# Patient Record
Sex: Female | Born: 1950
Health system: Southern US, Community
[De-identification: ages and names within clinical notes are randomized; demographics above are authoritative.]

## PROBLEM LIST (undated history)

## (undated) DIAGNOSIS — N6320 Unspecified lump in the left breast, unspecified quadrant: Secondary | ICD-10-CM

## (undated) DIAGNOSIS — Z87898 Personal history of other specified conditions: Secondary | ICD-10-CM

## (undated) DIAGNOSIS — E039 Hypothyroidism, unspecified: Secondary | ICD-10-CM

## (undated) HISTORY — PX: THYROIDECTOMY, PARTIAL: SHX18

## (undated) HISTORY — PX: BREAST CYST EXCISION: SHX579

## (undated) HISTORY — PX: SHOULDER ARTHROSCOPY: SHX128

## (undated) HISTORY — PX: EYE SURGERY: SHX253

## (undated) HISTORY — PX: ABDOMINAL HYSTERECTOMY: SHX81

## (undated) HISTORY — PX: APPENDECTOMY: SHX54

---

## 2003-05-20 ENCOUNTER — Ambulatory Visit (HOSPITAL_BASED_OUTPATIENT_CLINIC_OR_DEPARTMENT_OTHER): Admission: RE | Admit: 2003-05-20 | Discharge: 2003-05-20 | Payer: Self-pay | Admitting: Orthopedic Surgery

## 2003-05-20 ENCOUNTER — Ambulatory Visit (HOSPITAL_COMMUNITY): Admission: RE | Admit: 2003-05-20 | Discharge: 2003-05-20 | Payer: Self-pay | Admitting: Orthopedic Surgery

## 2004-08-25 ENCOUNTER — Ambulatory Visit: Payer: Self-pay | Admitting: Cardiology

## 2004-09-13 ENCOUNTER — Ambulatory Visit: Payer: Self-pay

## 2004-09-29 ENCOUNTER — Ambulatory Visit: Payer: Self-pay | Admitting: Cardiology

## 2004-10-26 ENCOUNTER — Ambulatory Visit: Payer: Self-pay | Admitting: Cardiology

## 2005-04-24 ENCOUNTER — Ambulatory Visit: Payer: Self-pay | Admitting: Cardiology

## 2005-05-03 ENCOUNTER — Ambulatory Visit: Payer: Self-pay | Admitting: Cardiology

## 2015-06-08 DIAGNOSIS — H2510 Age-related nuclear cataract, unspecified eye: Secondary | ICD-10-CM | POA: Insufficient documentation

## 2015-06-09 DIAGNOSIS — R1084 Generalized abdominal pain: Secondary | ICD-10-CM | POA: Insufficient documentation

## 2015-06-09 DIAGNOSIS — M75102 Unspecified rotator cuff tear or rupture of left shoulder, not specified as traumatic: Secondary | ICD-10-CM | POA: Insufficient documentation

## 2015-06-09 DIAGNOSIS — M545 Low back pain, unspecified: Secondary | ICD-10-CM | POA: Insufficient documentation

## 2015-06-09 DIAGNOSIS — M19071 Primary osteoarthritis, right ankle and foot: Secondary | ICD-10-CM | POA: Insufficient documentation

## 2015-06-09 DIAGNOSIS — M810 Age-related osteoporosis without current pathological fracture: Secondary | ICD-10-CM | POA: Insufficient documentation

## 2015-06-09 DIAGNOSIS — J342 Deviated nasal septum: Secondary | ICD-10-CM | POA: Insufficient documentation

## 2015-06-09 DIAGNOSIS — E039 Hypothyroidism, unspecified: Secondary | ICD-10-CM | POA: Insufficient documentation

## 2015-06-09 DIAGNOSIS — I872 Venous insufficiency (chronic) (peripheral): Secondary | ICD-10-CM | POA: Insufficient documentation

## 2015-06-09 DIAGNOSIS — H903 Sensorineural hearing loss, bilateral: Secondary | ICD-10-CM | POA: Insufficient documentation

## 2015-06-09 DIAGNOSIS — H524 Presbyopia: Secondary | ICD-10-CM | POA: Insufficient documentation

## 2015-06-09 DIAGNOSIS — H5201 Hypermetropia, right eye: Secondary | ICD-10-CM | POA: Insufficient documentation

## 2015-06-09 DIAGNOSIS — H43813 Vitreous degeneration, bilateral: Secondary | ICD-10-CM | POA: Insufficient documentation

## 2015-06-09 DIAGNOSIS — I10 Essential (primary) hypertension: Secondary | ICD-10-CM | POA: Insufficient documentation

## 2015-11-08 DIAGNOSIS — Z961 Presence of intraocular lens: Secondary | ICD-10-CM | POA: Insufficient documentation

## 2015-11-22 DIAGNOSIS — H401121 Primary open-angle glaucoma, left eye, mild stage: Secondary | ICD-10-CM | POA: Insufficient documentation

## 2015-11-22 DIAGNOSIS — H40021 Open angle with borderline findings, high risk, right eye: Secondary | ICD-10-CM | POA: Insufficient documentation

## 2016-03-28 DIAGNOSIS — R928 Other abnormal and inconclusive findings on diagnostic imaging of breast: Secondary | ICD-10-CM | POA: Insufficient documentation

## 2016-03-30 ENCOUNTER — Other Ambulatory Visit: Payer: Self-pay | Admitting: Family Medicine

## 2016-03-30 DIAGNOSIS — N6489 Other specified disorders of breast: Secondary | ICD-10-CM

## 2016-03-30 DIAGNOSIS — M810 Age-related osteoporosis without current pathological fracture: Secondary | ICD-10-CM

## 2016-03-30 DIAGNOSIS — R928 Other abnormal and inconclusive findings on diagnostic imaging of breast: Secondary | ICD-10-CM

## 2016-04-04 ENCOUNTER — Other Ambulatory Visit: Payer: Self-pay | Admitting: Ophthalmology

## 2016-04-04 DIAGNOSIS — R5381 Other malaise: Secondary | ICD-10-CM

## 2016-04-06 ENCOUNTER — Ambulatory Visit
Admission: RE | Admit: 2016-04-06 | Discharge: 2016-04-06 | Disposition: A | Discharge status: Home / Self Care | Payer: BLUE CROSS/BLUE SHIELD | Source: Ambulatory Visit | Attending: Family Medicine | Admitting: Family Medicine

## 2016-04-06 ENCOUNTER — Other Ambulatory Visit: Payer: Self-pay | Admitting: Family Medicine

## 2016-04-06 ENCOUNTER — Inpatient Hospital Stay: Admission: RE | Admit: 2016-04-06 | Payer: Self-pay | Source: Ambulatory Visit

## 2016-04-06 ENCOUNTER — Inpatient Hospital Stay
Admission: RE | Admit: 2016-04-06 | Discharge: 2016-04-06 | Disposition: A | Discharge status: Home / Self Care | Payer: Self-pay | Source: Ambulatory Visit | Attending: Family Medicine | Admitting: Family Medicine

## 2016-04-06 DIAGNOSIS — N6489 Other specified disorders of breast: Secondary | ICD-10-CM

## 2016-04-06 DIAGNOSIS — R928 Other abnormal and inconclusive findings on diagnostic imaging of breast: Secondary | ICD-10-CM

## 2017-03-23 ENCOUNTER — Other Ambulatory Visit: Payer: Self-pay | Admitting: Internal Medicine

## 2017-03-23 DIAGNOSIS — M25562 Pain in left knee: Secondary | ICD-10-CM

## 2017-03-23 DIAGNOSIS — M25561 Pain in right knee: Secondary | ICD-10-CM

## 2017-04-04 ENCOUNTER — Other Ambulatory Visit: Payer: BLUE CROSS/BLUE SHIELD

## 2017-04-05 ENCOUNTER — Ambulatory Visit
Admission: RE | Admit: 2017-04-05 | Discharge: 2017-04-05 | Disposition: A | Discharge status: Home / Self Care | Payer: No Typology Code available for payment source | Source: Ambulatory Visit | Attending: Internal Medicine | Admitting: Internal Medicine

## 2017-04-05 DIAGNOSIS — M25562 Pain in left knee: Secondary | ICD-10-CM

## 2017-04-05 DIAGNOSIS — M25561 Pain in right knee: Secondary | ICD-10-CM

## 2017-05-30 ENCOUNTER — Other Ambulatory Visit: Payer: Self-pay | Admitting: General Surgery

## 2017-05-30 DIAGNOSIS — R928 Other abnormal and inconclusive findings on diagnostic imaging of breast: Secondary | ICD-10-CM

## 2017-05-31 ENCOUNTER — Other Ambulatory Visit: Payer: Self-pay | Admitting: General Surgery

## 2017-05-31 DIAGNOSIS — N6489 Other specified disorders of breast: Secondary | ICD-10-CM

## 2017-06-08 ENCOUNTER — Ambulatory Visit
Admission: RE | Admit: 2017-06-08 | Discharge: 2017-06-08 | Disposition: A | Discharge status: Home / Self Care | Payer: PPO | Source: Ambulatory Visit | Attending: General Surgery | Admitting: General Surgery

## 2017-06-08 DIAGNOSIS — N6489 Other specified disorders of breast: Secondary | ICD-10-CM

## 2017-06-08 DIAGNOSIS — N6321 Unspecified lump in the left breast, upper outer quadrant: Secondary | ICD-10-CM | POA: Diagnosis not present

## 2017-06-08 DIAGNOSIS — R922 Inconclusive mammogram: Secondary | ICD-10-CM | POA: Diagnosis not present

## 2017-06-18 ENCOUNTER — Ambulatory Visit: Payer: PPO | Admitting: Podiatry

## 2017-06-18 ENCOUNTER — Encounter: Payer: Self-pay | Admitting: Podiatry

## 2017-06-18 VITALS — BP 135/84 | HR 62

## 2017-06-18 DIAGNOSIS — M79671 Pain in right foot: Secondary | ICD-10-CM | POA: Diagnosis not present

## 2017-06-18 DIAGNOSIS — Q828 Other specified congenital malformations of skin: Secondary | ICD-10-CM

## 2017-06-18 NOTE — Progress Notes (Signed)
   Subjective:    Patient ID: Aimee Daugherty, female    DOB: July 24, 1950, 67 y.o.   MRN: [id number]  HPI Aimee Daugherty presents the office today for concerns of a callus underneath her fifth toe on the right side which is been ongoing for about 6 months.  She states thata  "it just hurts".  She states that it hurts mostly with pressure in shoes.  She said no treatment.  She denies any redness or drainage or any swelling.  She has no other concerns today.   Review of Systems  All other systems reviewed and are negative.  No past medical history on file.  Past Surgical History:  Procedure Laterality Date  . BREAST CYST EXCISION      No current outpatient medications on file.  Allergies not on file  Social History   Socioeconomic History  . Marital status: Married    Spouse name: Not on file  . Number of children: Not on file  . Years of education: Not on file  . Highest education level: Not on file  Social Needs  . Financial resource strain: Not on file  . Food insecurity - worry: Not on file  . Food insecurity - inability: Not on file  . Transportation needs - medical: Not on file  . Transportation needs - non-medical: Not on file  Occupational History  . Not on file  Tobacco Use  . Smoking status: Never Smoker  . Smokeless tobacco: Never Used  Substance and Sexual Activity  . Alcohol use: No    Frequency: Never  . Drug use: No  . Sexual activity: Not on file  Other Topics Concern  . Not on file  Social History Narrative  . Not on file        Objective:   Physical Exam General: AAO x3, NAD  Dermatological: Hyperkeratotic tissue present right foot third metatarsal 5.  Upon debridement there is no underlying ulceration, drainage or any signs of infection noted.  There is prominence of metatarsal heads plantarly.  No other open lesions or pre-ulcerative lesions identified today.  Vascular: Dorsalis Pedis artery and Posterior Tibial artery pedal pulses are 2/4 bilateral with  immedate capillary fill time.  There is no pain with calf compression, swelling, warmth, erythema.   Neruologic: Grossly intact via light touch bilateral.  Protective threshold with Semmes Wienstein monofilament intact to all pedal sites bilateral.  Musculoskeletal: No gross boney pedal deformities bilateral. No pain, crepitus, or limitation noted with foot and ankle range of motion bilateral. Muscular strength 5/5 in all groups tested bilateral.  Gait: Unassisted, Nonantalgic.      Assessment & Plan:  67 year old female with symptomatic hyperkeratotic lesion right foot -Treatment options discussed including all alternatives, risks, and complications -Etiology of symptoms were discussed -Lesion was sharply debrided x1 without any complications or bleeding.  Offloading pads were dispensed.  Discussed moisturizer to the area daily.  She can use a pumice stone once a week lately to help file the area but do not go deep. -Monitor for any clinical signs or symptoms of infection and directed to call the office immediately should any occur or go to the ER. -Follow-up as needed.  Call any questions or concerns.  She agrees this plan.  We also states her symptoms have resolved when she was leaving.  Trula Slade DPM

## 2017-06-18 NOTE — Patient Instructions (Signed)
You can use a pumice stone once a week lightly over the callus area, no not go too deep. Apply a small amount of moisturizer to the area daily.   You can get gel metatarsal pads to help take pressure off of the area  I would keep some type of pad over the area to decrease the pressure to help prevent the callus from coming back so fast.   Monitor for any signs/symptoms of infection. Call the office immediately if any occur or go directly to the emergency room. Call with any questions/concerns.

## 2017-06-29 ENCOUNTER — Other Ambulatory Visit: Payer: Self-pay | Admitting: General Surgery

## 2017-06-29 DIAGNOSIS — R928 Other abnormal and inconclusive findings on diagnostic imaging of breast: Secondary | ICD-10-CM

## 2017-07-02 ENCOUNTER — Other Ambulatory Visit: Payer: Self-pay

## 2017-07-02 ENCOUNTER — Encounter (HOSPITAL_BASED_OUTPATIENT_CLINIC_OR_DEPARTMENT_OTHER): Payer: Self-pay | Admitting: *Deleted

## 2017-07-09 ENCOUNTER — Encounter (HOSPITAL_BASED_OUTPATIENT_CLINIC_OR_DEPARTMENT_OTHER)
Admission: RE | Admit: 2017-07-09 | Discharge: 2017-07-09 | Disposition: A | Discharge status: Home / Self Care | Payer: PPO | Source: Ambulatory Visit | Attending: General Surgery | Admitting: General Surgery

## 2017-07-09 DIAGNOSIS — Z01812 Encounter for preprocedural laboratory examination: Secondary | ICD-10-CM | POA: Diagnosis not present

## 2017-07-09 LAB — BASIC METABOLIC PANEL
ANION GAP: 12 (ref 5–15)
BUN: <5 mg/dL — ABNORMAL LOW (ref 6–20)
CALCIUM: 9.1 mg/dL (ref 8.9–10.3)
CO2: 27 mmol/L (ref 22–32)
Chloride: 99 mmol/L — ABNORMAL LOW (ref 101–111)
Creatinine, Ser: 0.63 mg/dL (ref 0.44–1.00)
GFR calc non Af Amer: >60 mL/min (ref >60)
Glucose, Bld: 88 mg/dL (ref 65–99)
Potassium: 2.8 mmol/L — ABNORMAL LOW (ref 3.5–5.1)
Sodium: 138 mmol/L (ref 135–145)

## 2017-07-09 NOTE — Progress Notes (Signed)
Ensure pre surgery drink given with instructions to complete by 0415 dos, pt verbalized understanding. 

## 2017-07-09 NOTE — Pre-Procedure Instructions (Signed)
K+ of 2.8 called to Abigail Butts at [facility]; she will notify Dr. Donne Hazel.

## 2017-07-11 DIAGNOSIS — R928 Other abnormal and inconclusive findings on diagnostic imaging of breast: Secondary | ICD-10-CM | POA: Diagnosis not present

## 2017-07-12 ENCOUNTER — Ambulatory Visit
Admission: RE | Admit: 2017-07-12 | Discharge: 2017-07-12 | Disposition: A | Discharge status: Home / Self Care | Payer: PPO | Source: Ambulatory Visit | Attending: General Surgery | Admitting: General Surgery

## 2017-07-12 DIAGNOSIS — N6489 Other specified disorders of breast: Secondary | ICD-10-CM | POA: Diagnosis not present

## 2017-07-12 DIAGNOSIS — R928 Other abnormal and inconclusive findings on diagnostic imaging of breast: Secondary | ICD-10-CM

## 2017-07-13 ENCOUNTER — Ambulatory Visit
Admit: 2017-07-13 | Discharge: 2017-07-13 | Disposition: A | Discharge status: Home / Self Care | Payer: PPO | Attending: General Surgery | Admitting: General Surgery

## 2017-07-13 ENCOUNTER — Other Ambulatory Visit: Payer: Self-pay

## 2017-07-13 ENCOUNTER — Encounter (HOSPITAL_BASED_OUTPATIENT_CLINIC_OR_DEPARTMENT_OTHER)
Admission: RE | Disposition: A | Discharge status: Home / Self Care | Payer: Self-pay | Source: Ambulatory Visit | Attending: General Surgery

## 2017-07-13 ENCOUNTER — Ambulatory Visit (HOSPITAL_BASED_OUTPATIENT_CLINIC_OR_DEPARTMENT_OTHER): Payer: PPO | Admitting: Anesthesiology

## 2017-07-13 ENCOUNTER — Ambulatory Visit (HOSPITAL_BASED_OUTPATIENT_CLINIC_OR_DEPARTMENT_OTHER)
Admission: RE | Admit: 2017-07-13 | Discharge: 2017-07-13 | Disposition: A | Discharge status: Home / Self Care | Payer: PPO | Source: Ambulatory Visit | Attending: General Surgery | Admitting: General Surgery

## 2017-07-13 ENCOUNTER — Encounter (HOSPITAL_BASED_OUTPATIENT_CLINIC_OR_DEPARTMENT_OTHER): Payer: Self-pay | Admitting: *Deleted

## 2017-07-13 DIAGNOSIS — N6320 Unspecified lump in the left breast, unspecified quadrant: Secondary | ICD-10-CM | POA: Diagnosis present

## 2017-07-13 DIAGNOSIS — Z79899 Other long term (current) drug therapy: Secondary | ICD-10-CM | POA: Insufficient documentation

## 2017-07-13 DIAGNOSIS — Z886 Allergy status to analgesic agent status: Secondary | ICD-10-CM | POA: Diagnosis not present

## 2017-07-13 DIAGNOSIS — Z8249 Family history of ischemic heart disease and other diseases of the circulatory system: Secondary | ICD-10-CM | POA: Diagnosis not present

## 2017-07-13 DIAGNOSIS — R928 Other abnormal and inconclusive findings on diagnostic imaging of breast: Secondary | ICD-10-CM | POA: Diagnosis not present

## 2017-07-13 DIAGNOSIS — E039 Hypothyroidism, unspecified: Secondary | ICD-10-CM | POA: Insufficient documentation

## 2017-07-13 DIAGNOSIS — N6012 Diffuse cystic mastopathy of left breast: Secondary | ICD-10-CM | POA: Diagnosis not present

## 2017-07-13 DIAGNOSIS — N63 Unspecified lump in unspecified breast: Secondary | ICD-10-CM | POA: Diagnosis not present

## 2017-07-13 DIAGNOSIS — Z888 Allergy status to other drugs, medicaments and biological substances status: Secondary | ICD-10-CM | POA: Insufficient documentation

## 2017-07-13 DIAGNOSIS — Z90710 Acquired absence of both cervix and uterus: Secondary | ICD-10-CM | POA: Insufficient documentation

## 2017-07-13 DIAGNOSIS — N6022 Fibroadenosis of left breast: Secondary | ICD-10-CM | POA: Diagnosis not present

## 2017-07-13 HISTORY — DX: Hypothyroidism, unspecified: E03.9

## 2017-07-13 HISTORY — DX: Unspecified lump in the left breast, unspecified quadrant: N63.20

## 2017-07-13 HISTORY — DX: Personal history of other specified conditions: Z87.898

## 2017-07-13 HISTORY — PX: RADIOACTIVE SEED GUIDED EXCISIONAL BREAST BIOPSY: SHX6490

## 2017-07-13 SURGERY — RADIOACTIVE SEED GUIDED BREAST BIOPSY
Anesthesia: General | Site: Breast | Laterality: Left

## 2017-07-13 MED ORDER — FENTANYL CITRATE (PF) 100 MCG/2ML IJ SOLN
INTRAMUSCULAR | Status: AC
  Filled 2017-07-13: qty 2

## 2017-07-13 MED ORDER — GABAPENTIN 300 MG PO CAPS
300.0000 mg | ORAL_CAPSULE | ORAL | Status: AC
  Administered 2017-07-13: 300 mg via ORAL

## 2017-07-13 MED ORDER — FENTANYL CITRATE (PF) 100 MCG/2ML IJ SOLN: 50.0000 ug | INTRAMUSCULAR | Status: DC | PRN

## 2017-07-13 MED ORDER — PROPOFOL 10 MG/ML IV BOLUS
INTRAVENOUS | Status: AC
  Filled 2017-07-13: qty 20

## 2017-07-13 MED ORDER — CEFAZOLIN SODIUM-DEXTROSE 2-4 GM/100ML-% IV SOLN
INTRAVENOUS | Status: AC
  Filled 2017-07-13: qty 100

## 2017-07-13 MED ORDER — GABAPENTIN 300 MG PO CAPS
ORAL_CAPSULE | ORAL | Status: AC
  Filled 2017-07-13: qty 1

## 2017-07-13 MED ORDER — FENTANYL CITRATE (PF) 100 MCG/2ML IJ SOLN
INTRAMUSCULAR | Status: DC | PRN
  Administered 2017-07-13: 100 ug via INTRAVENOUS

## 2017-07-13 MED ORDER — MIDAZOLAM HCL 2 MG/2ML IJ SOLN
INTRAMUSCULAR | Status: AC
  Filled 2017-07-13: qty 2

## 2017-07-13 MED ORDER — CEFAZOLIN SODIUM-DEXTROSE 2-4 GM/100ML-% IV SOLN
2.0000 g | INTRAVENOUS | Status: AC
  Administered 2017-07-13: 2 g via INTRAVENOUS

## 2017-07-13 MED ORDER — ACETAMINOPHEN 500 MG PO TABS
ORAL_TABLET | ORAL | Status: AC
  Filled 2017-07-13: qty 2

## 2017-07-13 MED ORDER — PHENYLEPHRINE HCL 10 MG/ML IJ SOLN
INTRAMUSCULAR | Status: DC | PRN
  Administered 2017-07-13: 80 ug via INTRAVENOUS

## 2017-07-13 MED ORDER — MIDAZOLAM HCL 2 MG/2ML IJ SOLN: 1.0000 mg | INTRAMUSCULAR | Status: DC | PRN

## 2017-07-13 MED ORDER — PHENYLEPHRINE HCL 10 MG/ML IJ SOLN
INTRAMUSCULAR | Status: AC
Start: 2017-07-13 — End: 2017-07-13
  Filled 2017-07-13: qty 1

## 2017-07-13 MED ORDER — SCOPOLAMINE 1 MG/3DAYS TD PT72: 1.0000 | MEDICATED_PATCH | Freq: Once | TRANSDERMAL | Status: DC | PRN

## 2017-07-13 MED ORDER — LIDOCAINE HCL (CARDIAC) 20 MG/ML IV SOLN
INTRAVENOUS | Status: DC | PRN
  Administered 2017-07-13: 30 mg via INTRAVENOUS

## 2017-07-13 MED ORDER — DEXAMETHASONE SODIUM PHOSPHATE 4 MG/ML IJ SOLN
INTRAMUSCULAR | Status: DC | PRN
  Administered 2017-07-13: 10 mg via INTRAVENOUS

## 2017-07-13 MED ORDER — OXYCODONE-ACETAMINOPHEN 10-325 MG PO TABS: 1.0000 | ORAL_TABLET | Freq: Three times a day (TID) | ORAL | 0 refills | Status: AC | PRN

## 2017-07-13 MED ORDER — BUPIVACAINE HCL (PF) 0.25 % IJ SOLN
INTRAMUSCULAR | Status: DC | PRN
  Administered 2017-07-13: 10 mL

## 2017-07-13 MED ORDER — FENTANYL CITRATE (PF) 100 MCG/2ML IJ SOLN: 25.0000 ug | INTRAMUSCULAR | Status: DC | PRN

## 2017-07-13 MED ORDER — LIDOCAINE 2% (20 MG/ML) 5 ML SYRINGE
INTRAMUSCULAR | Status: AC
  Filled 2017-07-13: qty 5

## 2017-07-13 MED ORDER — DEXTROSE 5 % IV SOLN
INTRAVENOUS | Status: DC | PRN
  Administered 2017-07-13: 25 ug/min via INTRAVENOUS

## 2017-07-13 MED ORDER — BUPIVACAINE HCL (PF) 0.25 % IJ SOLN
INTRAMUSCULAR | Status: AC
  Filled 2017-07-13: qty 30

## 2017-07-13 MED ORDER — LACTATED RINGERS IV SOLN
INTRAVENOUS | Status: DC
  Administered 2017-07-13: 07:00:00 via INTRAVENOUS

## 2017-07-13 MED ORDER — ONDANSETRON HCL 4 MG/2ML IJ SOLN
INTRAMUSCULAR | Status: DC | PRN
  Administered 2017-07-13: 4 mg via INTRAVENOUS

## 2017-07-13 MED ORDER — ACETAMINOPHEN 500 MG PO TABS
1000.0000 mg | ORAL_TABLET | ORAL | Status: AC
  Administered 2017-07-13: 1000 mg via ORAL

## 2017-07-13 MED ORDER — ONDANSETRON HCL 4 MG/2ML IJ SOLN
INTRAMUSCULAR | Status: AC
  Filled 2017-07-13: qty 2

## 2017-07-13 MED ORDER — OXYCODONE HCL 5 MG PO TABS: 5.0000 mg | ORAL_TABLET | Freq: Once | ORAL | Status: DC | PRN

## 2017-07-13 MED ORDER — ENSURE PRE-SURGERY PO LIQD: 592.0000 mL | Freq: Once | ORAL | Status: DC

## 2017-07-13 MED ORDER — OXYCODONE HCL 5 MG/5ML PO SOLN: 5.0000 mg | Freq: Once | ORAL | Status: DC | PRN

## 2017-07-13 MED ORDER — MIDAZOLAM HCL 5 MG/5ML IJ SOLN
INTRAMUSCULAR | Status: DC | PRN
  Administered 2017-07-13: 2 mg via INTRAVENOUS

## 2017-07-13 MED ORDER — EPHEDRINE 5 MG/ML INJ
INTRAVENOUS | Status: AC
  Filled 2017-07-13: qty 10

## 2017-07-13 MED ORDER — PROPOFOL 10 MG/ML IV BOLUS
INTRAVENOUS | Status: DC | PRN
  Administered 2017-07-13: 150 mg via INTRAVENOUS

## 2017-07-13 MED ORDER — ONDANSETRON HCL 4 MG/2ML IJ SOLN: 4.0000 mg | Freq: Four times a day (QID) | INTRAMUSCULAR | Status: DC | PRN

## 2017-07-13 MED ORDER — DEXAMETHASONE SODIUM PHOSPHATE 10 MG/ML IJ SOLN
INTRAMUSCULAR | Status: AC
  Filled 2017-07-13: qty 1

## 2017-07-13 SURGICAL SUPPLY — 62 items
ADH SKN CLS APL DERMABOND .7 (GAUZE/BANDAGES/DRESSINGS) ×1
APPLIER CLIP 9.375 MED OPEN (MISCELLANEOUS)
APR CLP MED 9.3 20 MLT OPN (MISCELLANEOUS)
BINDER BREAST LRG (GAUZE/BANDAGES/DRESSINGS) ×2 IMPLANT
BINDER BREAST MEDIUM (GAUZE/BANDAGES/DRESSINGS) IMPLANT
BINDER BREAST XLRG (GAUZE/BANDAGES/DRESSINGS) IMPLANT
BINDER BREAST XXLRG (GAUZE/BANDAGES/DRESSINGS) IMPLANT
BLADE SURG 15 STRL LF DISP TIS (BLADE) ×1 IMPLANT
BLADE SURG 15 STRL SS (BLADE) ×3
CANISTER SUC SOCK COL 7IN (MISCELLANEOUS) IMPLANT
CANISTER SUCT 1200ML W/VALVE (MISCELLANEOUS) IMPLANT
CHLORAPREP W/TINT 26ML (MISCELLANEOUS) ×3 IMPLANT
CLIP APPLIE 9.375 MED OPEN (MISCELLANEOUS) IMPLANT
CLIP VESOCCLUDE SM WIDE 6/CT (CLIP) IMPLANT
CLOSURE WOUND 1/2 X4 (GAUZE/BANDAGES/DRESSINGS) ×1
COVER BACK TABLE 60X90IN (DRAPES) ×3 IMPLANT
COVER MAYO STAND STRL (DRAPES) ×3 IMPLANT
COVER PROBE W GEL 5X96 (DRAPES) ×3 IMPLANT
DECANTER SPIKE VIAL GLASS SM (MISCELLANEOUS) IMPLANT
DERMABOND ADVANCED (GAUZE/BANDAGES/DRESSINGS) ×2
DERMABOND ADVANCED .7 DNX12 (GAUZE/BANDAGES/DRESSINGS) ×1 IMPLANT
DEVICE DUBIN W/COMP PLATE 8390 (MISCELLANEOUS) ×3 IMPLANT
DRAPE LAPAROSCOPIC ABDOMINAL (DRAPES) ×3 IMPLANT
DRAPE UTILITY XL STRL (DRAPES) ×3 IMPLANT
DRSG TEGADERM 4X4.75 (GAUZE/BANDAGES/DRESSINGS) IMPLANT
ELECT COATED BLADE 2.86 ST (ELECTRODE) ×3 IMPLANT
ELECT REM PT RETURN 9FT ADLT (ELECTROSURGICAL) ×3
ELECTRODE REM PT RTRN 9FT ADLT (ELECTROSURGICAL) ×1 IMPLANT
GAUZE SPONGE 4X4 12PLY STRL LF (GAUZE/BANDAGES/DRESSINGS) IMPLANT
GLOVE BIO SURGEON STRL SZ7 (GLOVE) ×6 IMPLANT
GLOVE BIOGEL PI IND STRL 7.0 (GLOVE) IMPLANT
GLOVE BIOGEL PI IND STRL 7.5 (GLOVE) ×1 IMPLANT
GLOVE BIOGEL PI INDICATOR 7.0 (GLOVE) ×6
GLOVE BIOGEL PI INDICATOR 7.5 (GLOVE) ×4
GLOVE SURG SS PI 7.0 STRL IVOR (GLOVE) ×2 IMPLANT
GOWN STRL REUS W/ TWL LRG LVL3 (GOWN DISPOSABLE) ×2 IMPLANT
GOWN STRL REUS W/TWL LRG LVL3 (GOWN DISPOSABLE) ×12
HEMOSTAT ARISTA ABSORB 3G PWDR (MISCELLANEOUS) IMPLANT
ILLUMINATOR WAVEGUIDE N/F (MISCELLANEOUS) IMPLANT
KIT MARKER MARGIN INK (KITS) ×3 IMPLANT
LIGHT WAVEGUIDE WIDE FLAT (MISCELLANEOUS) IMPLANT
NDL HYPO 25X1 1.5 SAFETY (NEEDLE) ×1 IMPLANT
NEEDLE HYPO 25X1 1.5 SAFETY (NEEDLE) ×3 IMPLANT
NS IRRIG 1000ML POUR BTL (IV SOLUTION) IMPLANT
PACK BASIN DAY SURGERY FS (CUSTOM PROCEDURE TRAY) ×3 IMPLANT
PENCIL BUTTON HOLSTER BLD 10FT (ELECTRODE) ×3 IMPLANT
SLEEVE SCD COMPRESS KNEE MED (MISCELLANEOUS) ×3 IMPLANT
SPONGE LAP 4X18 X RAY DECT (DISPOSABLE) ×3 IMPLANT
STRIP CLOSURE SKIN 1/2X4 (GAUZE/BANDAGES/DRESSINGS) ×2 IMPLANT
SUT MNCRL AB 4-0 PS2 18 (SUTURE) IMPLANT
SUT MON AB 5-0 PS2 18 (SUTURE) IMPLANT
SUT SILK 2 0 SH (SUTURE) IMPLANT
SUT VIC AB 2-0 SH 27 (SUTURE) ×3
SUT VIC AB 2-0 SH 27XBRD (SUTURE) ×1 IMPLANT
SUT VIC AB 3-0 SH 27 (SUTURE) ×3
SUT VIC AB 3-0 SH 27X BRD (SUTURE) ×1 IMPLANT
SYR CONTROL 10ML LL (SYRINGE) ×3 IMPLANT
TOWEL OR 17X24 6PK STRL BLUE (TOWEL DISPOSABLE) ×3 IMPLANT
TOWEL OR NON WOVEN STRL DISP B (DISPOSABLE) ×3 IMPLANT
TUBE CONNECTING 20'X1/4 (TUBING)
TUBE CONNECTING 20X1/4 (TUBING) IMPLANT
YANKAUER SUCT BULB TIP NO VENT (SUCTIONS) IMPLANT

## 2017-07-13 NOTE — Anesthesia Procedure Notes (Signed)
Procedure Name: LMA Insertion Date/Time: 07/13/2017 7:37 AM Performed by: Marrianne Mood, CRNA Pre-anesthesia Checklist: Patient identified, Emergency Drugs available, Suction available, Patient being monitored and Timeout performed Patient Re-evaluated:Patient Re-evaluated prior to induction Oxygen Delivery Method: Circle system utilized Preoxygenation: Pre-oxygenation with 100% oxygen Induction Type: IV induction Ventilation: Mask ventilation without difficulty LMA: LMA inserted LMA Size: 4.0 Number of attempts: 1 Airway Equipment and Method: Bite block Placement Confirmation: positive ETCO2 Tube secured with: Tape Dental Injury: Teeth and Oropharynx as per pre-operative assessment

## 2017-07-13 NOTE — Interval H&P Note (Signed)
History and Physical Interval Note:  07/13/2017 7:14 AM  Aimee Daugherty  has presented today for surgery, with the diagnosis of left breast mass  The various methods of treatment have been discussed with the patient and family. After consideration of risks, benefits and other options for treatment, the patient has consented to  Procedure(s): LEFT RADIOACTIVE SEED GUIDED EXCISIONAL BREAST BIOPSY ERAS PATHWAY (Left) as a surgical intervention .  The patient's history has been reviewed, patient examined, no change in status, stable for surgery.  I have reviewed the patient's chart and labs.  Questions were answered to the patient's satisfaction.     Rolm Bookbinder

## 2017-07-13 NOTE — H&P (Signed)
73 yof referred previously by Dr Bebe Shaggy for left breast distortion. she underwent screening mm that shows c density breasts. she has prior history of bilateral cysts that were excised. no family history. she did not have a mass or discharge present. she had a distortion in the left breast. this persisted on dx views. US shows only fibroglandular tissue present. stereo biopsy was performed. pathology is focal pash. this was deemed discordant by Dr Enriqueta Shutter and she was referred to discuss excision. she delayed excision and returns today. I had her repeat mm that shows persistent distortion in uoq of left breast. US shows only dense tissue and no adenopathy  Past Surgical History Rolm Bookbinder, MD; 06/29/2017 10:14 AM) Appendectomy  Breast Biopsy  Left. Breast Mass; Local Excision  Bilateral. Cataract Surgery  Bilateral. Hysterectomy (not due to cancer) - Complete  Shoulder Surgery  Left. Thyroid Surgery   Allergies Andee Poles Gerrigner, CMA; 06/29/2017 9:57 AM) CeleBREX *ANALGESICS - ANTI-INFLAMMATORY*  Allergies Reconciled   Medication History (Danielle Gerrigner, CMA; 06/29/2017 9:57 AM) Levothyroxine Sodium (75MCG Tablet, Oral) Active. HydroCHLOROthiazide (25MG  Tablet, Oral) Active. Latanoprost (0.005% Solution, Ophthalmic) Active. Medications Reconciled  Social History Rolm Bookbinder, MD; 06/29/2017 10:14 AM) Caffeine use  Tea. No drug use  Tobacco use  Never smoker.  Family History Rolm Bookbinder, MD; 06/29/2017 10:14 AM) Heart Disease  Father. Hypertension  Brother.  Vitals (Danielle Gerrigner CMA; 06/29/2017 9:57 AM) 06/29/2017 9:57 AM Weight: 171.5 lb Height: 65in Body Surface Area: 1.85 m Body Mass Index: 28.54 kg/m  Temp.: 98.65F(Oral)  Pulse: 94 (Regular)  BP: 102/80 (Sitting, Right Arm, Standard) Physical Exam Rolm Bookbinder MD; 06/29/2017 10:15 AM) General Mental Status-Alert. Orientation-Oriented X3. Head and  Neck Trachea-midline. Thyroid Gland Characteristics - normal size and consistency. Eye Sclera/Conjunctiva - Bilateral-No scleral icterus. Chest and Lung Exam Chest and lung exam reveals -quiet, even and easy respiratory effort with no use of accessory muscles and on auscultation, normal breath sounds, no adventitious sounds and normal vocal resonance. Breast Nipples-No Discharge. Breast Lump-No Palpable Breast Mass. Cardiovascular Cardiovascular examination reveals -normal heart sounds, regular rate and rhythm with no murmurs. Lymphatic Head & Neck General Head & Neck Lymphatics: Bilateral - Description - Normal. Axillary General Axillary Region: Bilateral - Description - Normal. Note: no [place] adenopathy   Assessment & Plan Rolm Bookbinder MD; 06/29/2017 10:17 AM) ABNORMAL MAMMOGRAM OF LEFT BREAST (R92.8) Story: we discussed distortion and discordance per radiology. discussed options of follow up vs excision. She understands due to distortion and discordant biopsy there is certainly chance of upgrading lesion and time delay could affect that. will now plan for seed guided excision. discussed risks and recovery.

## 2017-07-13 NOTE — Anesthesia Preprocedure Evaluation (Signed)
Anesthesia Evaluation  Patient identified by MRN, date of birth, ID band Patient awake    Reviewed: Allergy & Precautions, H&P , NPO status , Patient's Chart, lab work & pertinent test results  Airway Mallampati: II   Neck ROM: full    Dental   Pulmonary neg pulmonary ROS,    breath sounds clear to auscultation       Cardiovascular negative cardio ROS   Rhythm:regular Rate:Normal     Neuro/Psych    GI/Hepatic   Endo/Other  Hypothyroidism   Renal/GU      Musculoskeletal   Abdominal   Peds  Hematology   Anesthesia Other Findings   Reproductive/Obstetrics                             Anesthesia Physical Anesthesia Plan  ASA: II  Anesthesia Plan: General   Post-op Pain Management:    Induction: Intravenous  PONV Risk Score and Plan: 3 and Ondansetron, Dexamethasone, Midazolam and Treatment may vary due to age or medical condition  Airway Management Planned: LMA  Additional Equipment:   Intra-op Plan:   Post-operative Plan:   Informed Consent: I have reviewed the patients History and Physical, chart, labs and discussed the procedure including the risks, benefits and alternatives for the proposed anesthesia with the patient or authorized representative who has indicated his/her understanding and acceptance.     Plan Discussed with: CRNA, Anesthesiologist and Surgeon  Anesthesia Plan Comments:         Anesthesia Quick Evaluation

## 2017-07-13 NOTE — Discharge Instructions (Signed)
°Post Anesthesia Home Care Instructions ° °Activity: °Get plenty of rest for the remainder of the day. A responsible individual must stay with you for 24 hours following the procedure.  °For the next 24 hours, DO NOT: °-Drive a car °-Operate machinery °-Drink alcoholic beverages °-Take any medication unless instructed by your physician °-Make any legal decisions or sign important papers. ° °Meals: °Start with liquid foods such as gelatin or soup. Progress to regular foods as tolerated. Avoid greasy, spicy, heavy foods. If nausea and/or vomiting occur, drink only clear liquids until the nausea and/or vomiting subsides. Call your physician if vomiting continues. ° °Special Instructions/Symptoms: °Your throat may feel dry or sore from the anesthesia or the breathing tube placed in your throat during surgery. If this causes discomfort, gargle with warm salt water. The discomfort should disappear within 24 hours. ° °If you had a scopolamine patch placed behind your ear for the management of post- operative nausea and/or vomiting: ° °1. The medication in the patch is effective for 72 hours, after which it should be removed.  Wrap patch in a tissue and discard in the trash. Wash hands thoroughly with soap and water. °2. You may remove the patch earlier than 72 hours if you experience unpleasant side effects which may include dry mouth, dizziness or visual disturbances. °3. Avoid touching the patch. Wash your hands with soap and water after contact with the patch. °  ° ° ° °[facility] °Office Phone Number [phone] ° °POST OP INSTRUCTIONS ° °Always review your discharge instruction sheet given to you by the facility where your surgery was performed. ° °IF YOU HAVE DISABILITY OR FAMILY LEAVE FORMS, YOU MUST BRING THEM TO THE OFFICE FOR PROCESSING.  DO NOT GIVE THEM TO YOUR DOCTOR. ° °1. A prescription for pain medication may be given to you upon discharge.  Take your pain medication as prescribed, if  needed.  If narcotic pain medicine is not needed, then you may take acetaminophen (Tylenol), naprosyn (Alleve) or ibuprofen (Advil) as needed. °2. Take your usually prescribed medications unless otherwise directed °3. If you need a refill on your pain medication, please contact your pharmacy.  They will contact our office to request authorization.  Prescriptions will not be filled after 5pm or on week-ends. °4. You should eat very light the first 24 hours after surgery, such as soup, crackers, pudding, etc.  Resume your normal diet the day after surgery. °5. Most patients will experience some swelling and bruising in the breast.  Ice packs and a good support bra will help.  Wear the breast binder provided or a sports bra for 72 hours day and night.  After that wear a sports bra during the day until you return to the office. Swelling and bruising can take several days to resolve.  °6. It is common to experience some constipation if taking pain medication after surgery.  Increasing fluid intake and taking a stool softener will usually help or prevent this problem from occurring.  A mild laxative (Milk of Magnesia or Miralax) should be taken according to package directions if there are no bowel movements after 48 hours. °7. Unless discharge instructions indicate otherwise, you may remove your bandages 48 hours after surgery and you may shower at that time.  You may have steri-strips (small skin tapes) in place directly over the incision.  These strips should be left on the skin for 7-10 days and will come off on their own.  If your surgeon used skin glue   on the incision, you may shower in 24 hours.  The glue will flake off over the next 2-3 weeks.  Any sutures or staples will be removed at the office during your follow-up visit. °8. ACTIVITIES:  You may resume regular daily activities (gradually increasing) beginning the next day.  Wearing a good support bra or sports bra minimizes pain and swelling.  You may have  sexual intercourse when it is comfortable. °a. You may drive when you no longer are taking prescription pain medication, you can comfortably wear a seatbelt, and you can safely maneuver your car and apply brakes. °b. RETURN TO WORK:  ______________________________________________________________________________________ °9. You should see your doctor in the office for a follow-up appointment approximately two weeks after your surgery.  Your doctor’s nurse will typically make your follow-up appointment when she calls you with your pathology report.  Expect your pathology report 3-4 business days after your surgery.  You may call to check if you do not hear from us after three days. °10. OTHER INSTRUCTIONS: _______________________________________________________________________________________________ _____________________________________________________________________________________________________________________________________ °_____________________________________________________________________________________________________________________________________ °_____________________________________________________________________________________________________________________________________ ° °WHEN TO CALL DR WAKEFIELD: °1. Fever over 101.0 °2. Nausea and/or vomiting. °3. Extreme swelling or bruising. °4. Continued bleeding from incision. °5. Increased pain, redness, or drainage from the incision. ° °The clinic staff is available to answer your questions during regular business hours.  Please don’t hesitate to call and ask to speak to one of the nurses for clinical concerns.  If you have a medical emergency, go to the nearest emergency room or call 911.  A surgeon from [facility] is always on call at the hospital. ° °For further questions, please visit [email or web address] ° °

## 2017-07-13 NOTE — Op Note (Signed)
Preoperative diagnoses: left breast mass, discordant core biopsy Postoperative diagnosis: Same as above Procedure:Leftbreastseed guided excisional biopsy Surgeon: Dr. Serita Grammes Anesthesia: Gen. Estimated blood loss: minimal Complications: None Drains: None Specimens:Leftbreast tissue marked with paint Sponge and needle count correct at completion Disposition to recovery stable  Indications: This is a67 yof with left breast distortion and a discordant core biopsy.  We discussed options and she elected for seed guided excisional biopsy.   Procedure: After informed consent was obtained she was then taken to the operating room. She was given antibiotics.Sequential compression devices were on her legs. She was placed under general anesthesia without complication. Her chestwas then prepped and draped in the standard sterile surgical fashion. A surgical timeout was then performed.   The seed was in thelateralleftbreast. I made a curvilinear incision in the left breast as the seed appeared to be near the skin.  I infiltrated marcaine throughout the area.I then used the neoprobe to guide excision of the seedand surrounding tissue.Mammogram confirmed removal of seed and the clip.this was confirmed by radiology. This was then all sent to pathology. Hemostasis was observed. I closed the breast tissue with a 2-0 Vicryl. The dermis was closed with 3-0 Vicryl and the skin with 4-0 Monocryl.Dermabond and steristrips were placed on the incision. A breast binder was placed. She was transferred to recovery stable

## 2017-07-13 NOTE — Interval H&P Note (Signed)
History and Physical Interval Note:  07/13/2017 7:15 AM  Aimee Daugherty  has presented today for surgery, with the diagnosis of left breast mass  The various methods of treatment have been discussed with the patient and family. After consideration of risks, benefits and other options for treatment, the patient has consented to  Procedure(s): LEFT RADIOACTIVE SEED GUIDED EXCISIONAL BREAST BIOPSY ERAS PATHWAY (Left) as a surgical intervention .  The patient's history has been reviewed, patient examined, no change in status, stable for surgery.  I have reviewed the patient's chart and labs.  Questions were answered to the patient's satisfaction.     Rolm Bookbinder

## 2017-07-13 NOTE — Anesthesia Postprocedure Evaluation (Signed)
Anesthesia Post Note  Patient: Aimee Daugherty  Procedure(s) Performed: LEFT RADIOACTIVE SEED GUIDED EXCISIONAL BREAST BIOPSY ERAS PATHWAY (Left Breast)     Patient location during evaluation: PACU Anesthesia Type: General Level of consciousness: awake and alert Pain management: pain level controlled Vital Signs Assessment: post-procedure vital signs reviewed and stable Respiratory status: spontaneous breathing, nonlabored ventilation, respiratory function stable and patient connected to nasal cannula oxygen Cardiovascular status: blood pressure returned to baseline and stable Postop Assessment: no apparent nausea or vomiting Anesthetic complications: no    Last Vitals:  Vitals:   07/13/17 0824 07/13/17 0830  BP: 108/60 107/66  Pulse: 83 78  Resp: 17 15  Temp: 36.6 C   SpO2: 100% 100%    Last Pain:  Vitals:   07/13/17 0845  TempSrc:   PainSc: 0-No pain                 [name]

## 2017-07-13 NOTE — Transfer of Care (Signed)
Immediate Anesthesia Transfer of Care Note  Patient: Aimee Daugherty  Procedure(s) Performed: LEFT RADIOACTIVE SEED GUIDED EXCISIONAL BREAST BIOPSY ERAS PATHWAY (Left Breast)  Patient Location: PACU  Anesthesia Type:General  Level of Consciousness: awake  Airway & Oxygen Therapy: Patient Spontanous Breathing and Patient connected to face mask oxygen  Post-op Assessment: Report given to RN and Post -op Vital signs reviewed and stable  Post vital signs: Reviewed and stable  Last Vitals:  Vitals:   07/13/17 0642  BP: 119/74  Pulse: 76  Resp: 18  Temp: 36.7 C  SpO2: 100%    Last Pain:  Vitals:   07/13/17 0642  TempSrc: Oral  PainSc: 7       Patients Stated Pain Goal: 3 (46/27/03 5009)  Complications: No apparent anesthesia complications

## 2017-07-16 ENCOUNTER — Encounter (HOSPITAL_BASED_OUTPATIENT_CLINIC_OR_DEPARTMENT_OTHER): Payer: Self-pay | Admitting: General Surgery

## 2017-08-03 DIAGNOSIS — H903 Sensorineural hearing loss, bilateral: Secondary | ICD-10-CM | POA: Diagnosis not present

## 2017-08-07 DIAGNOSIS — M810 Age-related osteoporosis without current pathological fracture: Secondary | ICD-10-CM | POA: Diagnosis not present

## 2017-08-07 DIAGNOSIS — M5431 Sciatica, right side: Secondary | ICD-10-CM | POA: Diagnosis not present

## 2017-08-07 DIAGNOSIS — Z780 Asymptomatic menopausal state: Secondary | ICD-10-CM | POA: Diagnosis not present

## 2017-08-07 DIAGNOSIS — M545 Low back pain: Secondary | ICD-10-CM | POA: Diagnosis not present

## 2018-01-25 DIAGNOSIS — I10 Essential (primary) hypertension: Secondary | ICD-10-CM | POA: Diagnosis not present

## 2018-01-25 DIAGNOSIS — M47816 Spondylosis without myelopathy or radiculopathy, lumbar region: Secondary | ICD-10-CM | POA: Diagnosis not present

## 2018-01-25 DIAGNOSIS — Z23 Encounter for immunization: Secondary | ICD-10-CM | POA: Diagnosis not present

## 2018-01-25 DIAGNOSIS — E039 Hypothyroidism, unspecified: Secondary | ICD-10-CM | POA: Diagnosis not present

## 2018-01-25 DIAGNOSIS — M79651 Pain in right thigh: Secondary | ICD-10-CM | POA: Diagnosis not present

## 2018-02-14 ENCOUNTER — Ambulatory Visit (INDEPENDENT_AMBULATORY_CARE_PROVIDER_SITE_OTHER): Payer: PPO | Admitting: Orthopaedic Surgery

## 2018-02-20 ENCOUNTER — Ambulatory Visit (INDEPENDENT_AMBULATORY_CARE_PROVIDER_SITE_OTHER): Payer: PPO | Admitting: Orthopaedic Surgery

## 2018-02-27 ENCOUNTER — Ambulatory Visit (INDEPENDENT_AMBULATORY_CARE_PROVIDER_SITE_OTHER): Payer: PPO | Admitting: Orthopaedic Surgery

## 2018-02-28 DIAGNOSIS — Z23 Encounter for immunization: Secondary | ICD-10-CM | POA: Diagnosis not present

## 2018-03-18 ENCOUNTER — Ambulatory Visit (INDEPENDENT_AMBULATORY_CARE_PROVIDER_SITE_OTHER): Payer: PPO | Admitting: Orthopaedic Surgery

## 2018-04-11 ENCOUNTER — Ambulatory Visit (INDEPENDENT_AMBULATORY_CARE_PROVIDER_SITE_OTHER): Payer: PPO

## 2018-04-11 ENCOUNTER — Ambulatory Visit (INDEPENDENT_AMBULATORY_CARE_PROVIDER_SITE_OTHER): Payer: PPO | Admitting: Orthopaedic Surgery

## 2018-04-11 ENCOUNTER — Encounter (INDEPENDENT_AMBULATORY_CARE_PROVIDER_SITE_OTHER): Payer: Self-pay | Admitting: Orthopaedic Surgery

## 2018-04-11 DIAGNOSIS — M25551 Pain in right hip: Secondary | ICD-10-CM

## 2018-04-11 NOTE — Progress Notes (Signed)
Office Visit Note   Patient: Aimee Daugherty           Date of Birth: 11-08-1950           MRN: [id number] Visit Date: 04/11/2018              Requested by: [place] [address] [place], [place] [address] PCP: Rubie Maid, MD   Assessment & Plan: Visit Diagnoses:  1. Right hip pain     Plan: She has just joined the [facility] so given her note to give the trainers and the people in the workout area there to help with her strengthening her hip and her quads.  There are certain machines that will help give her motor strong and active and hopefully mobile.  Thus far do not see anything else that would recommend other [facility] or even [facility] outpatient physical therapy to strengthen her lower extremities.  She can alternate Tylenol arthritis and Aleve.  All question concerns were answered addressed.  If she does not have any good results from this I want her to call and come back and see [place] and I will probably work-up her spine at that point.  Follow-Up Instructions: Return if symptoms worsen or fail to improve.   Orders:  Orders Placed This Encounter  Procedures  . XR HIP UNILAT W OR W/O PELVIS 2-3 VIEWS RIGHT   No orders of the defined types were placed in this encounter.     Procedures: No procedures performed   Clinical Data: No additional findings.   Subjective: Chief Complaint  Patient presents with  . Right Hip - Pain  The patient is a very pleasant 67 year old female who comes in for advice and treatment of right leg and thigh stiffness and weakness.  She denies any groin pain denies any knee pain.  If she is been driving for long period of time she is starting to have stiffness and pain when she gets out of the car or gets up.  She is having difficulty going up and down some stairs as well.  The pain is mainly in her thigh.  She does not wake up with pain at night.  Is been slowly getting worse with time.  She denies any numbness and  tingling in her feet.  She denies any recent injuries.  HPI  Review of Systems She currently denies any headache, chest pain, shortness of breath, fever, chills, nausea, vomiting.  Objective: Vital Signs: There were no vitals taken for this visit.  Physical Exam She is alert and oriented x3 and in no acute distress Ortho Exam Examination of both lower extremity shows basically normal exam.  She has excellent strength in her thighs and quads on isolated exam but she can definitely tell that she is having some weakness.  Her hip and knee have full range of motion with no difficulties at all.  Both knees have patellofemoral crepitation.  Both feet are strong.  She is able to flex and extend at the lumbar spine.  I did observe her getting up to the exam table and out of a chair and she is certainly stiff when she first gets up. Specialty Comments:  No specialty comments available.  Imaging: Xr Hip Unilat W Or W/o Pelvis 2-3 Views Right  Result Date: 04/11/2018 An AP pelvis and lateral of the right hip shows no acute findings.  The hip joint space is well-maintained on both hips.    PMFS History:  There are no active problems to display for this patient.  Past Medical History:  Diagnosis Date  . History of peripheral edema   . Hypothyroidism   . Left breast mass     History reviewed. No pertinent family history.  Past Surgical History:  Procedure Laterality Date  . ABDOMINAL HYSTERECTOMY    . APPENDECTOMY    . BREAST CYST EXCISION Left    x2  . EYE SURGERY Bilateral    cataract  . RADIOACTIVE SEED GUIDED EXCISIONAL BREAST BIOPSY Left 07/13/2017   Procedure: LEFT RADIOACTIVE SEED GUIDED EXCISIONAL BREAST BIOPSY ERAS PATHWAY;  Surgeon: Rolm Bookbinder, MD;  Location: [facility];  Service: General;  Laterality: Left;  . SHOULDER ARTHROSCOPY    . THYROIDECTOMY, PARTIAL     Social History   Occupational History  . Not on file  Tobacco Use  . Smoking  status: Never Smoker  . Smokeless tobacco: Never Used  Substance and Sexual Activity  . Alcohol use: No    Frequency: Never  . Drug use: No  . Sexual activity: Not on file

## 2018-06-17 DIAGNOSIS — R6889 Other general symptoms and signs: Secondary | ICD-10-CM | POA: Insufficient documentation

## 2018-06-28 DIAGNOSIS — D126 Benign neoplasm of colon, unspecified: Secondary | ICD-10-CM | POA: Diagnosis not present

## 2018-06-28 DIAGNOSIS — K449 Diaphragmatic hernia without obstruction or gangrene: Secondary | ICD-10-CM | POA: Diagnosis not present

## 2018-06-28 DIAGNOSIS — K648 Other hemorrhoids: Secondary | ICD-10-CM | POA: Diagnosis not present

## 2018-06-28 DIAGNOSIS — K5730 Diverticulosis of large intestine without perforation or abscess without bleeding: Secondary | ICD-10-CM | POA: Diagnosis not present

## 2018-06-28 DIAGNOSIS — D120 Benign neoplasm of cecum: Secondary | ICD-10-CM | POA: Diagnosis not present

## 2018-06-28 DIAGNOSIS — Z86010 Personal history of colonic polyps: Secondary | ICD-10-CM | POA: Diagnosis not present

## 2018-06-28 DIAGNOSIS — K621 Rectal polyp: Secondary | ICD-10-CM | POA: Diagnosis not present

## 2018-06-28 DIAGNOSIS — K626 Ulcer of anus and rectum: Secondary | ICD-10-CM | POA: Diagnosis not present

## 2018-06-28 DIAGNOSIS — R1013 Epigastric pain: Secondary | ICD-10-CM | POA: Diagnosis not present

## 2018-06-28 DIAGNOSIS — K6289 Other specified diseases of anus and rectum: Secondary | ICD-10-CM | POA: Diagnosis not present

## 2018-06-28 DIAGNOSIS — Z1211 Encounter for screening for malignant neoplasm of colon: Secondary | ICD-10-CM | POA: Diagnosis not present

## 2018-07-18 DIAGNOSIS — E039 Hypothyroidism, unspecified: Secondary | ICD-10-CM | POA: Diagnosis not present

## 2018-07-18 DIAGNOSIS — I10 Essential (primary) hypertension: Secondary | ICD-10-CM | POA: Diagnosis not present

## 2018-07-25 ENCOUNTER — Ambulatory Visit: Payer: PPO | Admitting: Podiatry

## 2018-08-02 ENCOUNTER — Ambulatory Visit: Payer: PPO | Admitting: Podiatry

## 2018-08-02 DIAGNOSIS — E039 Hypothyroidism, unspecified: Secondary | ICD-10-CM | POA: Diagnosis not present

## 2018-08-02 DIAGNOSIS — I10 Essential (primary) hypertension: Secondary | ICD-10-CM | POA: Diagnosis not present

## 2018-08-02 DIAGNOSIS — Z0000 Encounter for general adult medical examination without abnormal findings: Secondary | ICD-10-CM | POA: Diagnosis not present

## 2018-10-11 ENCOUNTER — Other Ambulatory Visit: Payer: Self-pay | Admitting: Family Medicine

## 2018-10-11 DIAGNOSIS — Z1231 Encounter for screening mammogram for malignant neoplasm of breast: Secondary | ICD-10-CM

## 2018-10-16 ENCOUNTER — Other Ambulatory Visit: Payer: Self-pay | Admitting: General Surgery

## 2018-10-16 DIAGNOSIS — N644 Mastodynia: Secondary | ICD-10-CM

## 2018-10-17 ENCOUNTER — Ambulatory Visit: Payer: PPO

## 2018-10-17 ENCOUNTER — Other Ambulatory Visit: Payer: Self-pay

## 2018-10-17 ENCOUNTER — Ambulatory Visit
Admission: RE | Admit: 2018-10-17 | Discharge: 2018-10-17 | Disposition: A | Discharge status: Home / Self Care | Payer: PPO | Source: Ambulatory Visit | Attending: General Surgery | Admitting: General Surgery

## 2018-10-17 DIAGNOSIS — N644 Mastodynia: Secondary | ICD-10-CM

## 2018-10-17 DIAGNOSIS — R922 Inconclusive mammogram: Secondary | ICD-10-CM | POA: Diagnosis not present

## 2018-10-28 DIAGNOSIS — R079 Chest pain, unspecified: Secondary | ICD-10-CM | POA: Diagnosis not present

## 2018-10-28 DIAGNOSIS — R0789 Other chest pain: Secondary | ICD-10-CM | POA: Diagnosis not present

## 2018-10-28 DIAGNOSIS — I10 Essential (primary) hypertension: Secondary | ICD-10-CM | POA: Diagnosis not present

## 2018-10-29 DIAGNOSIS — R079 Chest pain, unspecified: Secondary | ICD-10-CM | POA: Diagnosis not present

## 2018-10-30 DIAGNOSIS — R079 Chest pain, unspecified: Secondary | ICD-10-CM | POA: Diagnosis not present

## 2018-10-30 DIAGNOSIS — R0789 Other chest pain: Secondary | ICD-10-CM | POA: Diagnosis not present

## 2018-10-30 DIAGNOSIS — R072 Precordial pain: Secondary | ICD-10-CM | POA: Diagnosis not present

## 2018-10-30 DIAGNOSIS — R05 Cough: Secondary | ICD-10-CM | POA: Diagnosis not present

## 2018-10-31 ENCOUNTER — Ambulatory Visit (INDEPENDENT_AMBULATORY_CARE_PROVIDER_SITE_OTHER): Payer: PPO

## 2018-10-31 ENCOUNTER — Other Ambulatory Visit: Payer: Self-pay

## 2018-10-31 ENCOUNTER — Ambulatory Visit: Payer: PPO | Admitting: Podiatry

## 2018-10-31 VITALS — Temp 97.6°F

## 2018-10-31 DIAGNOSIS — M779 Enthesopathy, unspecified: Secondary | ICD-10-CM

## 2018-10-31 DIAGNOSIS — M7752 Other enthesopathy of left foot: Secondary | ICD-10-CM | POA: Diagnosis not present

## 2018-10-31 DIAGNOSIS — M19072 Primary osteoarthritis, left ankle and foot: Secondary | ICD-10-CM

## 2018-10-31 DIAGNOSIS — M7750 Other enthesopathy of unspecified foot: Secondary | ICD-10-CM | POA: Diagnosis not present

## 2018-10-31 MED ORDER — DICLOFENAC SODIUM 1 % TD GEL: 2.0000 g | Freq: Four times a day (QID) | TRANSDERMAL | 2 refills | Status: AC

## 2018-11-04 DIAGNOSIS — M940 Chondrocostal junction syndrome [Tietze]: Secondary | ICD-10-CM | POA: Diagnosis not present

## 2018-11-04 DIAGNOSIS — R0789 Other chest pain: Secondary | ICD-10-CM | POA: Diagnosis not present

## 2018-11-06 NOTE — Progress Notes (Signed)
Subjective:   Patient ID: Aimee Daugherty, female   DOB: 68 y.o.   MRN: [id number]   HPI 68 year old female presents the office today for concerns of pain to the top of the left foot which is been under the last 6 months.  She states it hurts to walk, most of her shoes do cause discomfort.  She denies any numbness or tingling.  No recent injury.  No swelling.   Review of Systems  All other systems reviewed and are negative.  Past Medical History:  Diagnosis Date  . History of peripheral edema   . Hypothyroidism   . Left breast mass     Past Surgical History:  Procedure Laterality Date  . ABDOMINAL HYSTERECTOMY    . APPENDECTOMY    . BREAST CYST EXCISION Left    x2  . EYE SURGERY Bilateral    cataract  . RADIOACTIVE SEED GUIDED EXCISIONAL BREAST BIOPSY Left 07/13/2017   Procedure: LEFT RADIOACTIVE SEED GUIDED EXCISIONAL BREAST BIOPSY ERAS PATHWAY;  Surgeon: Rolm Bookbinder, MD;  Location: [place];  Service: General;  Laterality: Left;  . SHOULDER ARTHROSCOPY    . THYROIDECTOMY, PARTIAL       Current Outpatient Medications:  .  diclofenac sodium (VOLTAREN) 1 % GEL, Apply 2 g topically 4 (four) times daily. Rub into affected area of foot 2 to 4 times daily, Disp: 100 g, Rfl: 2 .  hydrochlorothiazide (HYDRODIURIL) 25 MG tablet, Take 25 mg by mouth daily., Disp: , Rfl:  .  levothyroxine (SYNTHROID, LEVOTHROID) 75 MCG tablet, Take 75 mcg by mouth daily before breakfast., Disp: , Rfl:  .  potassium chloride SA (K-DUR,KLOR-CON) 20 MEQ tablet, Take 40 mEq by mouth daily., Disp: , Rfl:   Allergies  Allergen Reactions  . Celebrex [Celecoxib] Rash         Objective:  Physical Exam  General: AAO x3, NAD  Dermatological: Skin is warm, dry and supple bilateral. Nails x 10 are well manicured; remaining integument appears unremarkable at this time. There are no open sores, no preulcerative lesions, no rash or signs of infection present.  Vascular: Dorsalis  Pedis artery and Posterior Tibial artery pedal pulses are 2/4 bilateral with immedate capillary fill time. Pedal hair growth present. No varicosities and no lower extremity edema present bilateral. There is no pain with calf compression, swelling, warmth, erythema.   Neruologic: Grossly intact via light touch bilateral. Protective threshold with Semmes Wienstein monofilament intact to all pedal sites bilateral.  Negative Tinel sign.  Musculoskeletal: Subjectively she does complain the dorsal aspect the left foot most along Lisfranc joint there is no area pinpoint bony tenderness or pain to vibratory sensation.  There is no significant edema, erythema, increase in warmth.  Flexor, extensor tendons appear to be intact.  Muscular strength 5/5 in all groups tested bilateral.  Gait: Unassisted, Nonantalgic.       Assessment:   Left foot dorsal midfoot pain, capsulitis with osteoarthritis    Daugherty:  -Treatment options discussed including all alternatives, risks, and complications -Etiology of symptoms were discussed -X-rays were obtained and reviewed with the patient.  No acute fracture.  Arthritic changes are present in the Lisfranc joint. -Prescribed Voltaren gel. -We discussed shoe modifications and orthotics.  Think orthotic will help take some pressure off the dorsal foot.  Return in about 6 weeks (around 12/12/2018).  Trula Slade DPM

## 2018-12-12 ENCOUNTER — Ambulatory Visit: Payer: PPO | Admitting: Podiatry

## 2019-01-24 DIAGNOSIS — R1084 Generalized abdominal pain: Secondary | ICD-10-CM | POA: Diagnosis not present

## 2019-01-24 DIAGNOSIS — E039 Hypothyroidism, unspecified: Secondary | ICD-10-CM | POA: Diagnosis not present

## 2019-01-24 DIAGNOSIS — I10 Essential (primary) hypertension: Secondary | ICD-10-CM | POA: Diagnosis not present

## 2019-01-28 DIAGNOSIS — E039 Hypothyroidism, unspecified: Secondary | ICD-10-CM | POA: Diagnosis not present

## 2019-01-28 DIAGNOSIS — K7689 Other specified diseases of liver: Secondary | ICD-10-CM | POA: Diagnosis not present

## 2019-01-28 DIAGNOSIS — K824 Cholesterolosis of gallbladder: Secondary | ICD-10-CM | POA: Diagnosis not present

## 2019-01-28 DIAGNOSIS — R1084 Generalized abdominal pain: Secondary | ICD-10-CM | POA: Diagnosis not present

## 2019-02-26 DIAGNOSIS — N644 Mastodynia: Secondary | ICD-10-CM | POA: Diagnosis not present

## 2019-03-18 DIAGNOSIS — Z961 Presence of intraocular lens: Secondary | ICD-10-CM | POA: Diagnosis not present

## 2019-03-18 DIAGNOSIS — H26493 Other secondary cataract, bilateral: Secondary | ICD-10-CM | POA: Diagnosis not present

## 2019-03-18 DIAGNOSIS — H40013 Open angle with borderline findings, low risk, bilateral: Secondary | ICD-10-CM | POA: Diagnosis not present

## 2019-03-24 DIAGNOSIS — Z23 Encounter for immunization: Secondary | ICD-10-CM | POA: Diagnosis not present

## 2019-04-01 DIAGNOSIS — H26492 Other secondary cataract, left eye: Secondary | ICD-10-CM | POA: Diagnosis not present

## 2019-06-13 DIAGNOSIS — B029 Zoster without complications: Secondary | ICD-10-CM | POA: Diagnosis not present

## 2019-09-01 DIAGNOSIS — H903 Sensorineural hearing loss, bilateral: Secondary | ICD-10-CM | POA: Diagnosis not present

## 2019-09-09 DIAGNOSIS — L578 Other skin changes due to chronic exposure to nonionizing radiation: Secondary | ICD-10-CM | POA: Diagnosis not present

## 2019-09-09 DIAGNOSIS — L570 Actinic keratosis: Secondary | ICD-10-CM | POA: Diagnosis not present

## 2019-09-09 DIAGNOSIS — L300 Nummular dermatitis: Secondary | ICD-10-CM | POA: Diagnosis not present

## 2019-09-09 DIAGNOSIS — L814 Other melanin hyperpigmentation: Secondary | ICD-10-CM | POA: Diagnosis not present

## 2019-09-09 DIAGNOSIS — I781 Nevus, non-neoplastic: Secondary | ICD-10-CM | POA: Diagnosis not present

## 2019-09-09 DIAGNOSIS — D485 Neoplasm of uncertain behavior of skin: Secondary | ICD-10-CM | POA: Diagnosis not present

## 2019-09-09 DIAGNOSIS — L820 Inflamed seborrheic keratosis: Secondary | ICD-10-CM | POA: Diagnosis not present

## 2019-09-22 DIAGNOSIS — H903 Sensorineural hearing loss, bilateral: Secondary | ICD-10-CM | POA: Diagnosis not present

## 2019-10-24 DIAGNOSIS — N644 Mastodynia: Secondary | ICD-10-CM | POA: Diagnosis not present

## 2019-10-31 ENCOUNTER — Other Ambulatory Visit: Payer: Self-pay | Admitting: General Surgery

## 2019-10-31 DIAGNOSIS — N644 Mastodynia: Secondary | ICD-10-CM

## 2019-11-20 ENCOUNTER — Ambulatory Visit
Admission: RE | Admit: 2019-11-20 | Discharge: 2019-11-20 | Disposition: A | Discharge status: Home / Self Care | Payer: PPO | Source: Ambulatory Visit | Attending: General Surgery | Admitting: General Surgery

## 2019-11-20 ENCOUNTER — Other Ambulatory Visit: Payer: Self-pay

## 2019-11-20 ENCOUNTER — Ambulatory Visit: Payer: PPO

## 2019-11-20 DIAGNOSIS — R922 Inconclusive mammogram: Secondary | ICD-10-CM | POA: Diagnosis not present

## 2019-11-20 DIAGNOSIS — N644 Mastodynia: Secondary | ICD-10-CM

## 2020-01-09 DIAGNOSIS — G4452 New daily persistent headache (NDPH): Secondary | ICD-10-CM | POA: Diagnosis not present

## 2020-01-09 DIAGNOSIS — E039 Hypothyroidism, unspecified: Secondary | ICD-10-CM | POA: Diagnosis not present

## 2020-01-09 DIAGNOSIS — I10 Essential (primary) hypertension: Secondary | ICD-10-CM | POA: Diagnosis not present

## 2020-01-14 DIAGNOSIS — R519 Headache, unspecified: Secondary | ICD-10-CM | POA: Diagnosis not present

## 2020-01-14 DIAGNOSIS — G4452 New daily persistent headache (NDPH): Secondary | ICD-10-CM | POA: Diagnosis not present

## 2020-02-05 DIAGNOSIS — B356 Tinea cruris: Secondary | ICD-10-CM | POA: Diagnosis not present

## 2020-03-24 DIAGNOSIS — Z23 Encounter for immunization: Secondary | ICD-10-CM | POA: Diagnosis not present

## 2020-05-04 DIAGNOSIS — Z961 Presence of intraocular lens: Secondary | ICD-10-CM | POA: Diagnosis not present

## 2020-05-04 DIAGNOSIS — H40013 Open angle with borderline findings, low risk, bilateral: Secondary | ICD-10-CM | POA: Diagnosis not present

## 2020-05-04 DIAGNOSIS — H524 Presbyopia: Secondary | ICD-10-CM | POA: Diagnosis not present

## 2020-05-21 DIAGNOSIS — J069 Acute upper respiratory infection, unspecified: Secondary | ICD-10-CM | POA: Diagnosis not present

## 2020-05-21 DIAGNOSIS — Z1152 Encounter for screening for COVID-19: Secondary | ICD-10-CM | POA: Diagnosis not present

## 2020-05-21 DIAGNOSIS — J029 Acute pharyngitis, unspecified: Secondary | ICD-10-CM | POA: Diagnosis not present

## 2020-09-28 DIAGNOSIS — L821 Other seborrheic keratosis: Secondary | ICD-10-CM | POA: Diagnosis not present

## 2020-09-28 DIAGNOSIS — D1801 Hemangioma of skin and subcutaneous tissue: Secondary | ICD-10-CM | POA: Diagnosis not present

## 2020-09-28 DIAGNOSIS — L814 Other melanin hyperpigmentation: Secondary | ICD-10-CM | POA: Diagnosis not present

## 2020-09-28 DIAGNOSIS — X32XXXS Exposure to sunlight, sequela: Secondary | ICD-10-CM | POA: Diagnosis not present

## 2020-09-28 DIAGNOSIS — I781 Nevus, non-neoplastic: Secondary | ICD-10-CM | POA: Diagnosis not present

## 2020-09-28 DIAGNOSIS — L578 Other skin changes due to chronic exposure to nonionizing radiation: Secondary | ICD-10-CM | POA: Diagnosis not present

## 2020-09-28 DIAGNOSIS — D235 Other benign neoplasm of skin of trunk: Secondary | ICD-10-CM | POA: Diagnosis not present

## 2020-09-28 DIAGNOSIS — D485 Neoplasm of uncertain behavior of skin: Secondary | ICD-10-CM | POA: Diagnosis not present

## 2020-09-29 ENCOUNTER — Other Ambulatory Visit: Payer: Self-pay | Admitting: General Surgery

## 2020-09-29 DIAGNOSIS — Z1231 Encounter for screening mammogram for malignant neoplasm of breast: Secondary | ICD-10-CM

## 2020-12-06 ENCOUNTER — Inpatient Hospital Stay: Admission: RE | Admit: 2020-12-06 | Payer: PPO | Source: Ambulatory Visit

## 2021-01-17 DIAGNOSIS — J069 Acute upper respiratory infection, unspecified: Secondary | ICD-10-CM | POA: Diagnosis not present

## 2021-01-20 DIAGNOSIS — R0982 Postnasal drip: Secondary | ICD-10-CM | POA: Diagnosis not present

## 2021-01-20 DIAGNOSIS — J310 Chronic rhinitis: Secondary | ICD-10-CM | POA: Diagnosis not present

## 2021-01-20 DIAGNOSIS — I10 Essential (primary) hypertension: Secondary | ICD-10-CM | POA: Diagnosis not present

## 2021-01-20 DIAGNOSIS — E039 Hypothyroidism, unspecified: Secondary | ICD-10-CM | POA: Diagnosis not present

## 2021-01-31 DIAGNOSIS — E876 Hypokalemia: Secondary | ICD-10-CM | POA: Diagnosis not present

## 2021-02-16 DIAGNOSIS — Z23 Encounter for immunization: Secondary | ICD-10-CM | POA: Diagnosis not present

## 2021-02-16 DIAGNOSIS — E876 Hypokalemia: Secondary | ICD-10-CM | POA: Diagnosis not present

## 2021-03-23 ENCOUNTER — Other Ambulatory Visit: Payer: Self-pay | Admitting: General Surgery

## 2021-03-23 DIAGNOSIS — Z1231 Encounter for screening mammogram for malignant neoplasm of breast: Secondary | ICD-10-CM

## 2021-05-02 ENCOUNTER — Ambulatory Visit
Admission: RE | Admit: 2021-05-02 | Discharge: 2021-05-02 | Disposition: A | Discharge status: Home / Self Care | Payer: PPO | Source: Ambulatory Visit | Attending: General Surgery | Admitting: General Surgery

## 2021-05-02 DIAGNOSIS — Z1231 Encounter for screening mammogram for malignant neoplasm of breast: Secondary | ICD-10-CM | POA: Diagnosis not present

## 2021-05-28 IMAGING — [facility] DIGITAL DIAGNOSTIC BILATERAL MAMMOGRAM WITH TOMO AND CAD
8 series · 8 of 24 positions shown · non-contrast
Comparison: Previous exam(s).

CLINICAL DATA: Patient presents complaining of bilateral, left
greater right, breast pain.No reported lumps. Patient underwent
surgical excision breast lesions June 2017. This is her first
mammogram since that surgery.

EXAM:
DIGITAL DIAGNOSTIC BILATERAL MAMMOGRAM WITH CAD AND TOMO

[R CC synth-2D]
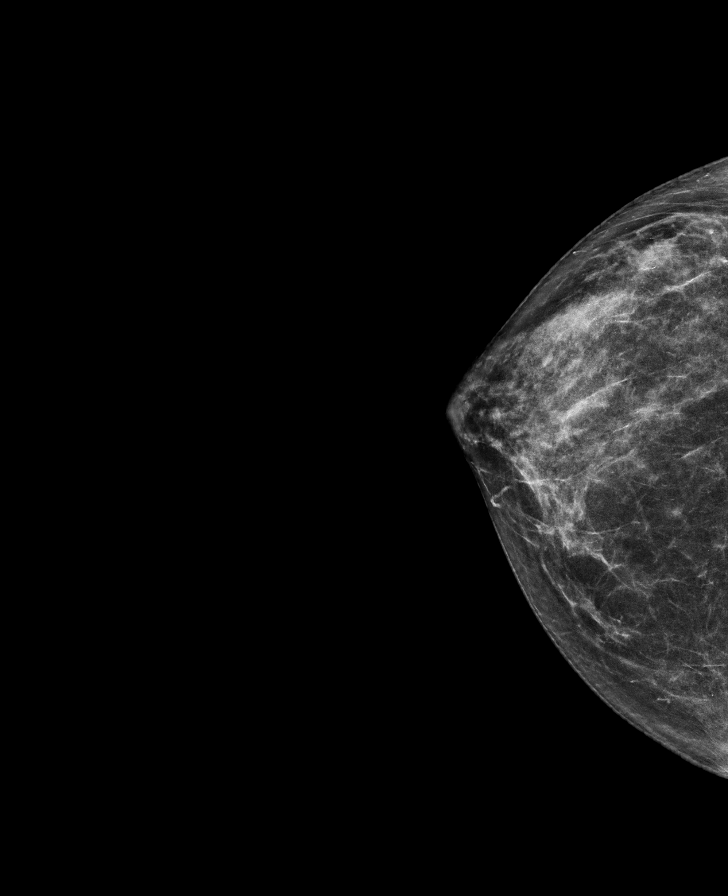

[R MLO synth-2D]
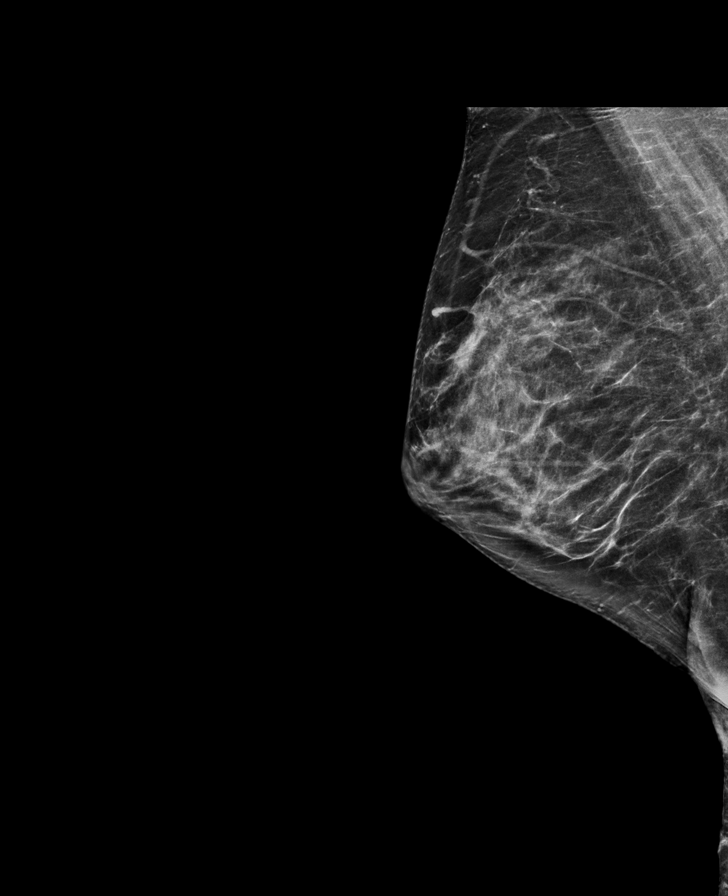

[L MLO synth-2D]
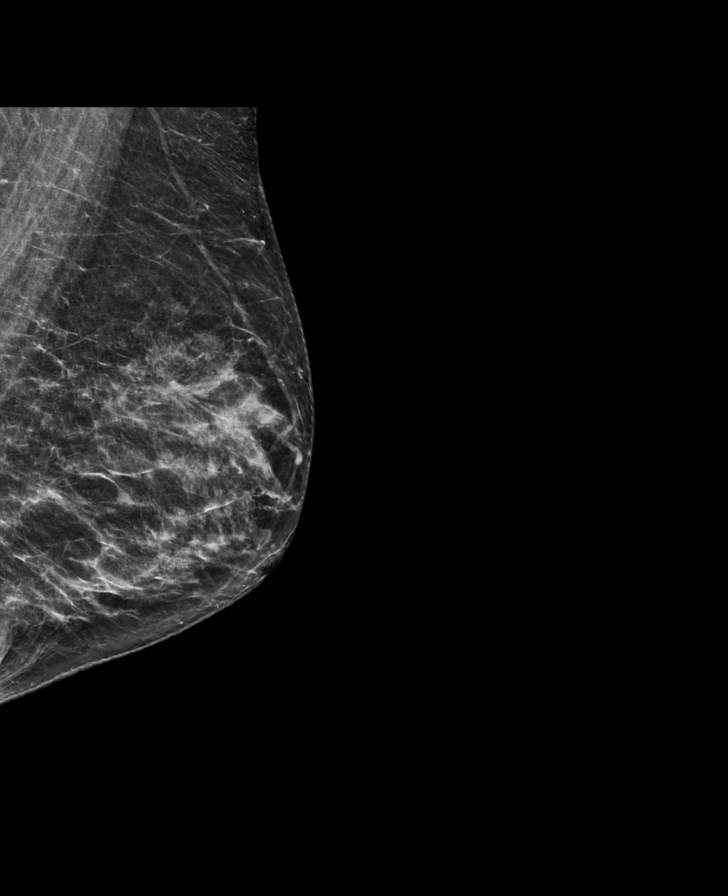

[L CC synth-2D]
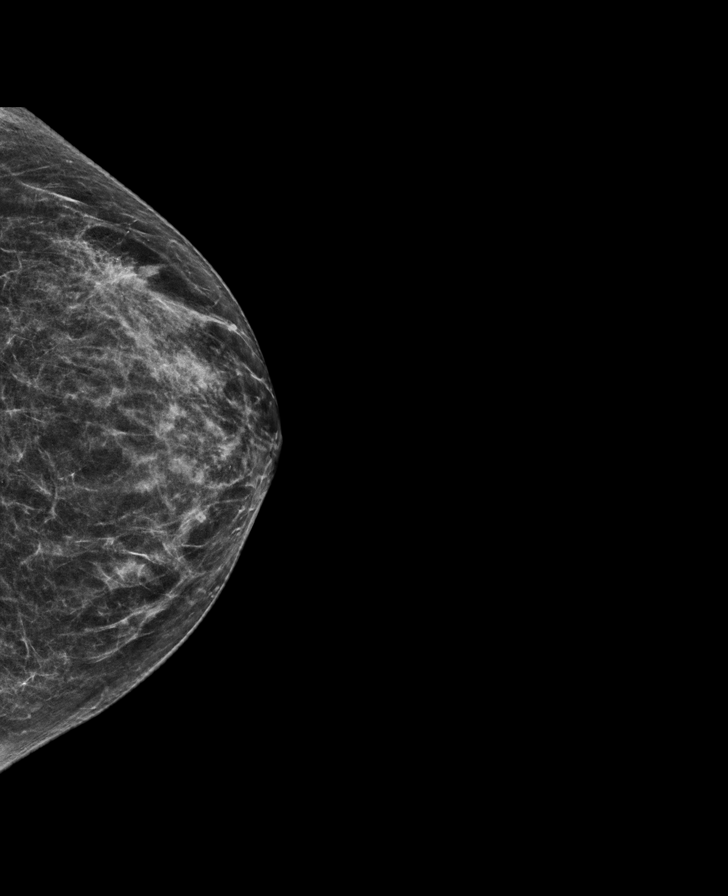

[R CC tomo · tomo slice 31/62.0]
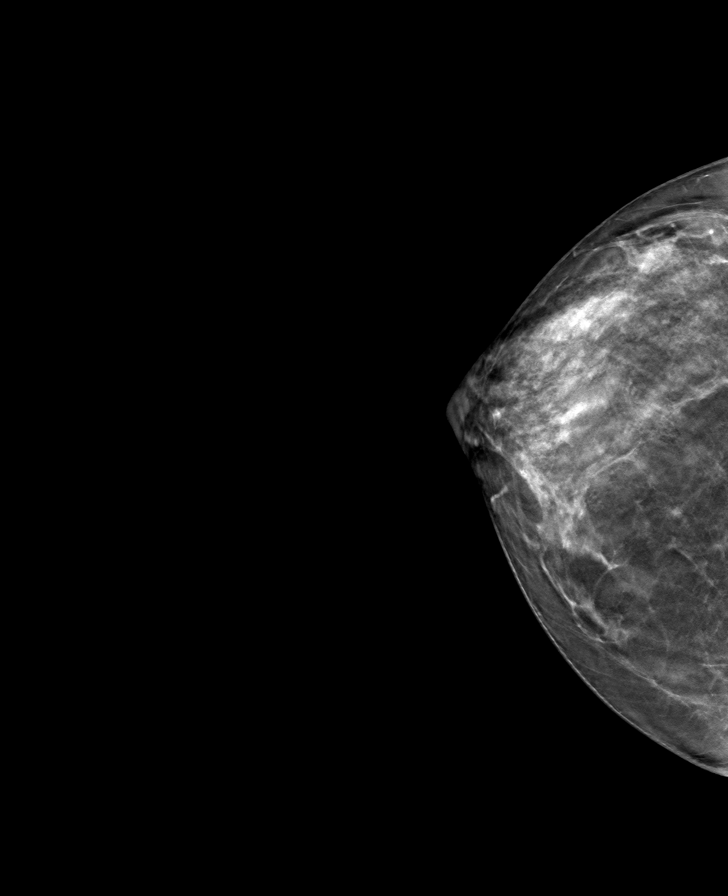

[L CC tomo · tomo slice 31/60.0]
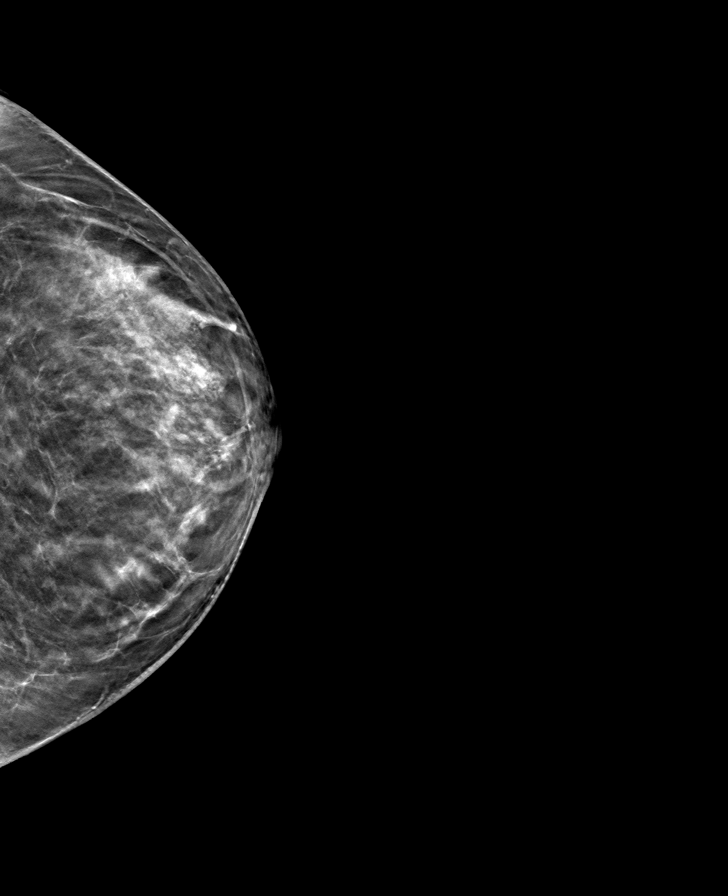

[R MLO tomo · tomo slice 35/69.0]
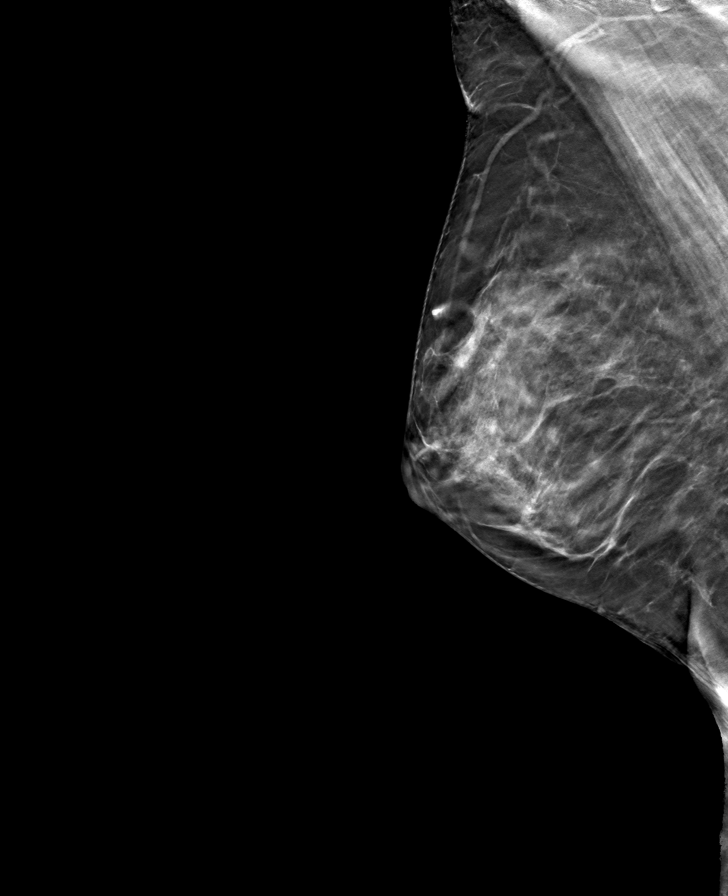

[L MLO tomo · tomo slice 31/62.0]
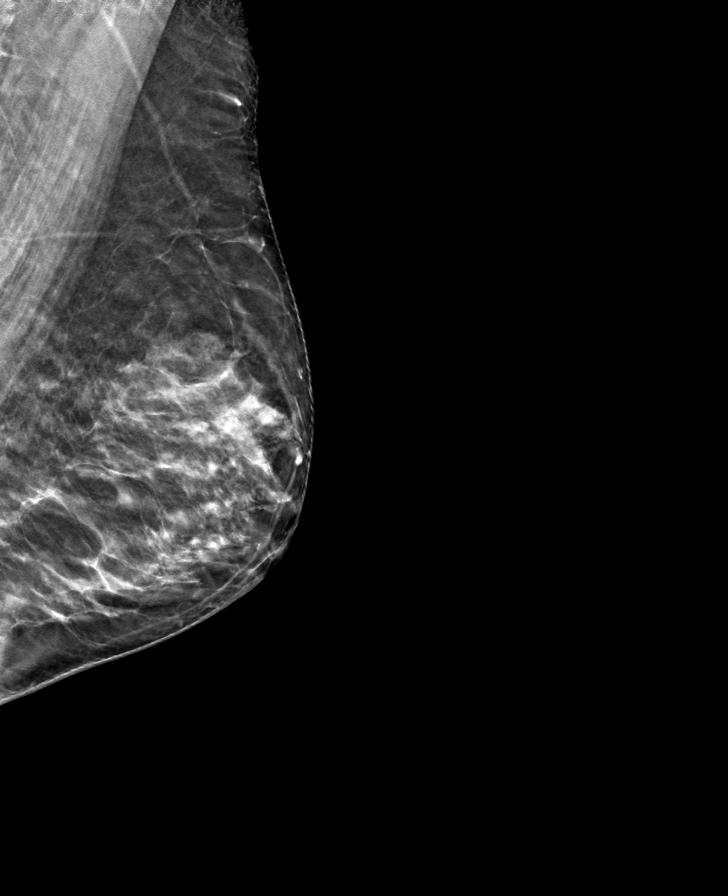

[8 of 24 positions shown; findings below may reference images not displayed]

ACR Breast Density Category c: The breast tissue is heterogeneously
dense, which may obscure small masses.
FINDINGS: In the left breast, there is postsurgical architectural distortion
laterally, new from prior exams. There are no areas of nonsurgical
architectural distortion, there are no breast masses or new or
suspicious calcifications.

Mammographic images were processed with CAD.
IMPRESSION: 1. No evidence of breast malignancy.
2. Benign postsurgical scarring on the left.

RECOMMENDATION:
Screening mammogram in one year.(Code:SO-7-KJY)

I have discussed the findings and recommendations with the patient.
Results were also provided in writing at the conclusion of the
visit. If applicable, a reminder letter will be sent to the patient
regarding the next appointment.

BI-RADS CATEGORY  2: Benign.

## 2021-09-20 ENCOUNTER — Other Ambulatory Visit: Payer: Self-pay | Admitting: General Surgery

## 2021-09-20 DIAGNOSIS — Z1231 Encounter for screening mammogram for malignant neoplasm of breast: Secondary | ICD-10-CM

## 2022-05-03 ENCOUNTER — Ambulatory Visit: Payer: PPO

## 2022-08-10 ENCOUNTER — Other Ambulatory Visit (INDEPENDENT_AMBULATORY_CARE_PROVIDER_SITE_OTHER): Payer: PPO

## 2022-08-10 ENCOUNTER — Ambulatory Visit (INDEPENDENT_AMBULATORY_CARE_PROVIDER_SITE_OTHER): Payer: PPO | Admitting: Orthopaedic Surgery

## 2022-08-10 DIAGNOSIS — G8929 Other chronic pain: Secondary | ICD-10-CM | POA: Diagnosis not present

## 2022-08-10 DIAGNOSIS — M25561 Pain in right knee: Secondary | ICD-10-CM

## 2022-08-10 DIAGNOSIS — M25562 Pain in left knee: Secondary | ICD-10-CM

## 2022-08-10 NOTE — Progress Notes (Signed)
The patient comes in today with a history of bilateral knee pain with the left worse than the right in terms of hurting and she said the left knee feels unstable.  She has had no known injuries.  She said a very long time ago she had injections in her knees.  She was told that she may need surgery at some point.  She has not had any injuries to her knees and said she has a harder time going up and down stairs.  She has not had any physical therapy either.  She denies any acute medical issues.  Her husband is with her today as well.  Examination of both knees shows normal alignment.  There is no effusion of either knee.  Both knees have full range of motion and are ligamentously stable with no significant pain today at all.  Both knees are cool.  AP and lateral of both knees were obtained and show normal alignment.  The medial lateral compartments are well-maintained and there are some slight patellofemoral narrowing.  She is someone that I would definitely recommend outpatient physical therapy to strengthen the muscles around her knee based on what I am seeing on clinical exam and x-ray findings.  [facility] [employer] and said [facility] and have [facility] work on knee strengthening exercises that some of the machines and I agree with this as well.  It does not sound like she needs any type of injections and she could certainly try copper fit knee sleeves but there is really nothing else I recommend for now other than strengthen the muscles around her knees.  She agrees with this assessment and plan.  Follow-up is as needed unless things worsen.
